# Patient Record
Sex: Female | Born: 1995 | Race: White | Hispanic: No | Marital: Single | Smoking: Never smoker
Health system: Southern US, Community
[De-identification: ages and names within clinical notes are randomized; demographics above are authoritative.]

## PROBLEM LIST (undated history)

## (undated) DIAGNOSIS — D649 Anemia, unspecified: Secondary | ICD-10-CM

## (undated) DIAGNOSIS — Z789 Other specified health status: Secondary | ICD-10-CM

## (undated) HISTORY — PX: NO PAST SURGERIES: SHX2092

---

## 2016-03-29 ENCOUNTER — Encounter (HOSPITAL_COMMUNITY): Payer: Self-pay | Admitting: Obstetrics and Gynecology

## 2016-03-29 ENCOUNTER — Other Ambulatory Visit (HOSPITAL_COMMUNITY): Payer: Self-pay | Admitting: Obstetrics and Gynecology

## 2016-03-29 DIAGNOSIS — Z3689 Encounter for other specified antenatal screening: Secondary | ICD-10-CM

## 2016-03-29 DIAGNOSIS — Z3A20 20 weeks gestation of pregnancy: Secondary | ICD-10-CM

## 2016-03-29 DIAGNOSIS — IMO0002 Reserved for concepts with insufficient information to code with codable children: Secondary | ICD-10-CM

## 2016-04-05 ENCOUNTER — Encounter (HOSPITAL_COMMUNITY): Payer: Self-pay

## 2016-04-08 ENCOUNTER — Encounter (HOSPITAL_COMMUNITY): Payer: Self-pay

## 2016-04-08 ENCOUNTER — Other Ambulatory Visit (HOSPITAL_COMMUNITY): Payer: Self-pay | Admitting: Obstetrics and Gynecology

## 2016-04-08 ENCOUNTER — Ambulatory Visit (HOSPITAL_COMMUNITY)
Admission: RE | Admit: 2016-04-08 | Discharge: 2016-04-08 | Disposition: A | Discharge status: Home / Self Care | Payer: Medicaid Other | Source: Ambulatory Visit | Attending: Obstetrics and Gynecology | Admitting: Obstetrics and Gynecology

## 2016-04-08 DIAGNOSIS — O358XX0 Maternal care for other (suspected) fetal abnormality and damage, not applicable or unspecified: Secondary | ICD-10-CM | POA: Insufficient documentation

## 2016-04-08 DIAGNOSIS — Z3689 Encounter for other specified antenatal screening: Secondary | ICD-10-CM

## 2016-04-08 DIAGNOSIS — Z3A20 20 weeks gestation of pregnancy: Secondary | ICD-10-CM

## 2016-04-08 DIAGNOSIS — Z36 Encounter for antenatal screening of mother: Secondary | ICD-10-CM | POA: Insufficient documentation

## 2016-04-08 DIAGNOSIS — IMO0002 Reserved for concepts with insufficient information to code with codable children: Secondary | ICD-10-CM

## 2016-04-08 DIAGNOSIS — O359XX0 Maternal care for (suspected) fetal abnormality and damage, unspecified, not applicable or unspecified: Secondary | ICD-10-CM

## 2016-04-08 HISTORY — DX: Other specified health status: Z78.9

## 2016-04-15 ENCOUNTER — Encounter (HOSPITAL_COMMUNITY): Payer: Self-pay

## 2017-02-08 ENCOUNTER — Encounter (HOSPITAL_COMMUNITY): Payer: Self-pay

## 2017-07-14 LAB — OB RESULTS CONSOLE HEPATITIS B SURFACE ANTIGEN: HEP B S AG: NEGATIVE

## 2017-07-14 LAB — OB RESULTS CONSOLE HIV ANTIBODY (ROUTINE TESTING): HIV: NONREACTIVE

## 2017-07-14 LAB — OB RESULTS CONSOLE GC/CHLAMYDIA
Chlamydia: NEGATIVE
Gonorrhea: NEGATIVE

## 2017-07-14 LAB — OB RESULTS CONSOLE RPR: RPR: NONREACTIVE

## 2017-07-14 LAB — OB RESULTS CONSOLE RUBELLA ANTIBODY, IGM: Rubella: IMMUNE

## 2017-08-01 IMAGING — US US MFM OB DETAIL+14 WK
1 series · 14 of 28 positions shown · non-contrast
Comparison: none

[Series 1: us mfm ob detail+14 wk · 79 acquisitions, 14 frames shown]
[im 3/79]
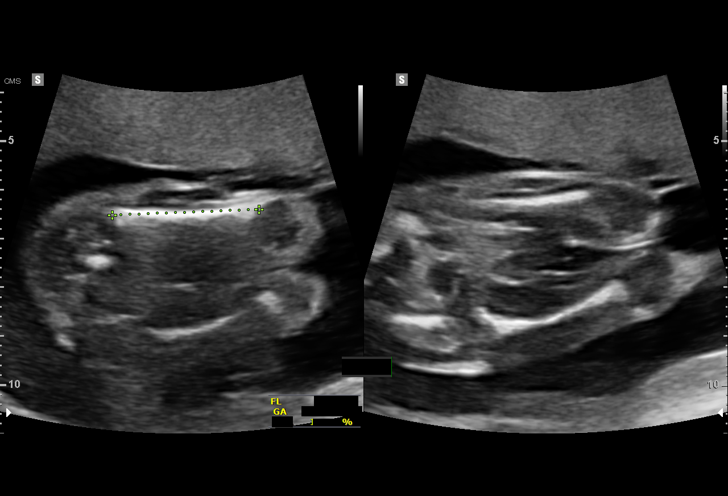
[im 9/79]
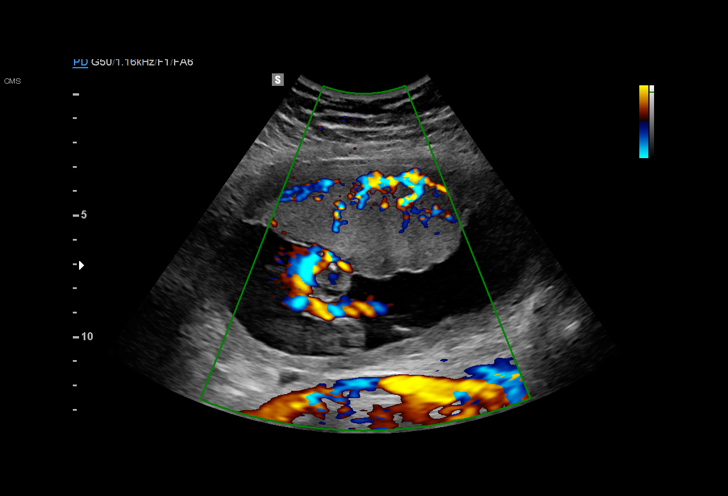
[im 15/79]
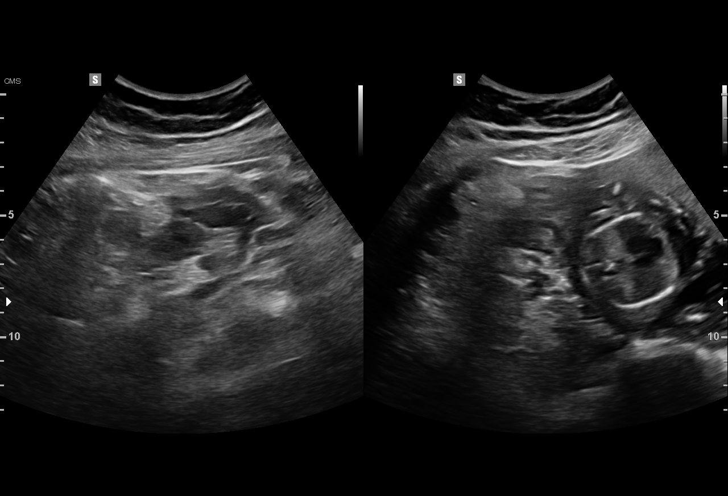
[im 21/79]
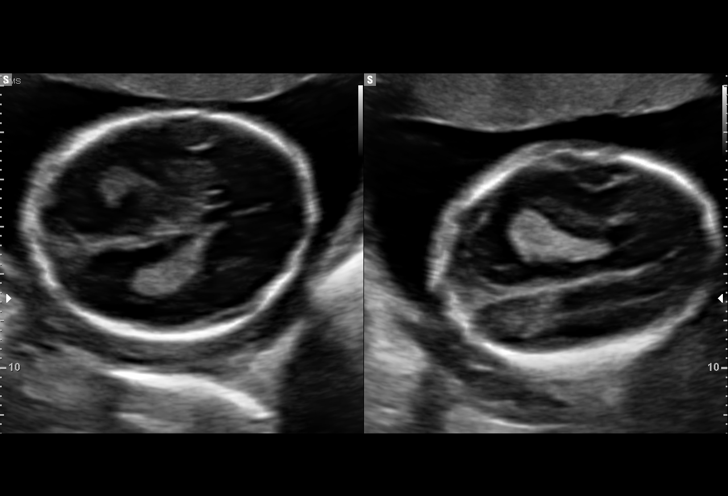
[im 27/79]
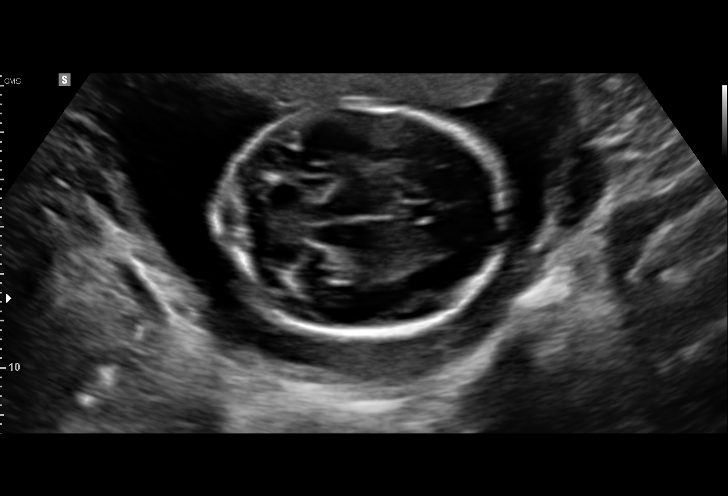
[im 32/79]
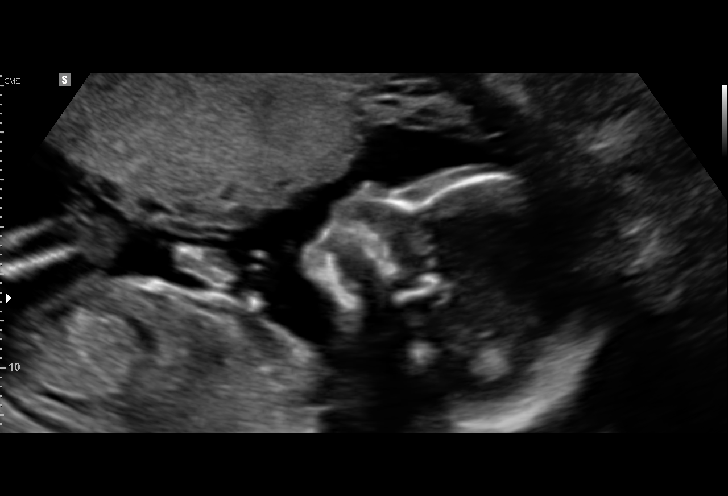
[im 38/79]
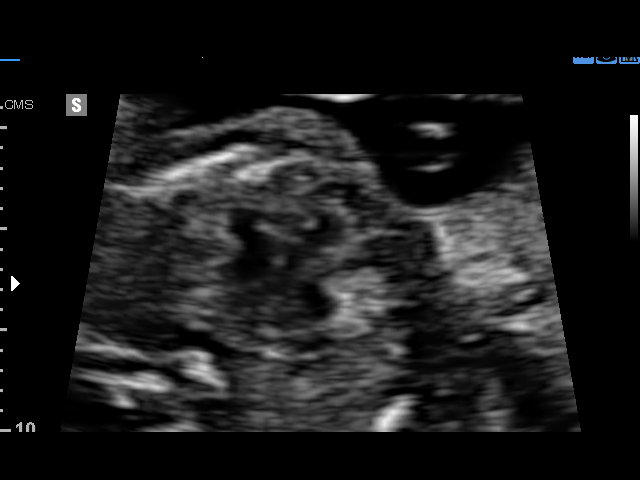
[im 44/79]
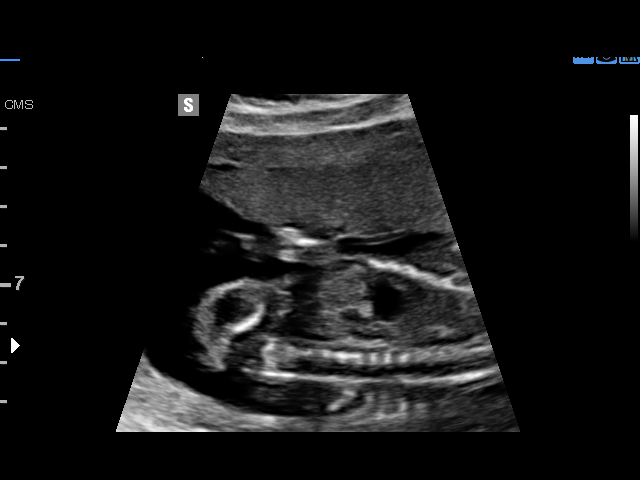
[im 50/79]
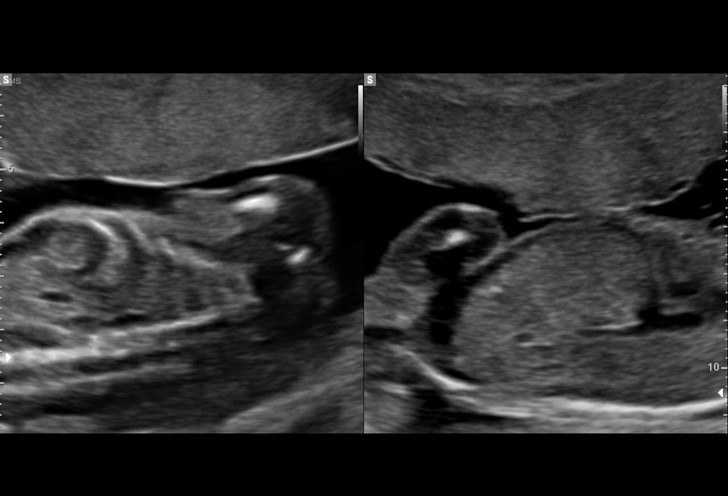
[im 55/79]
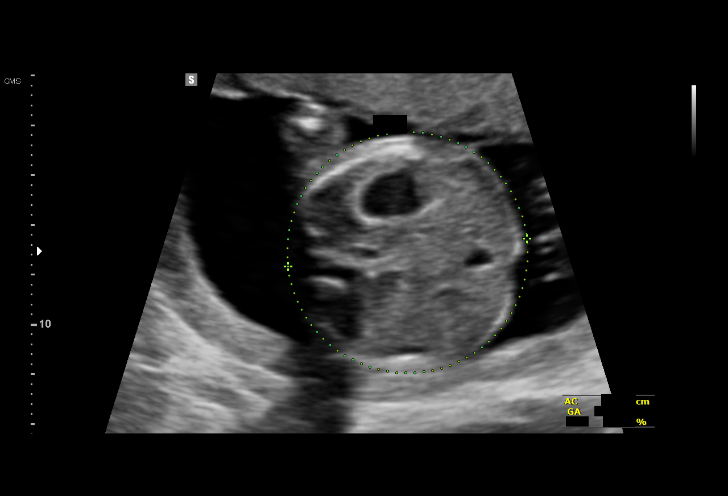
[im 61/79]
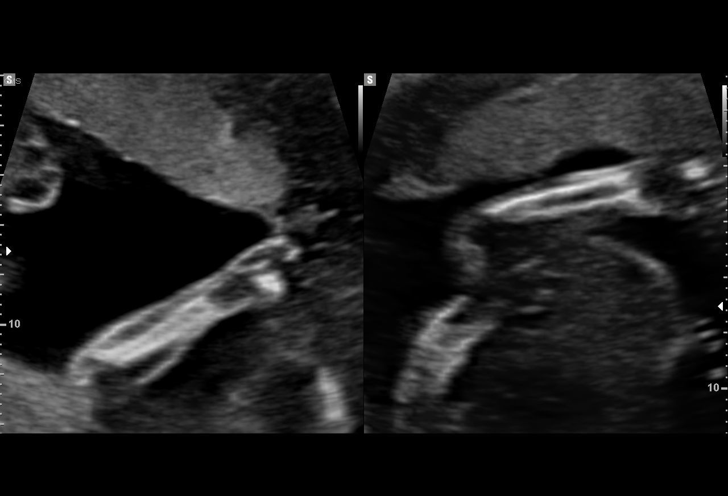
[im 67/79]
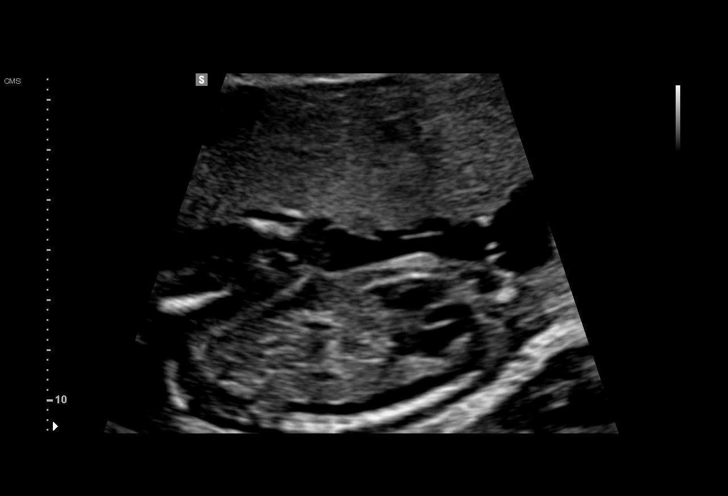
[im 73/79]
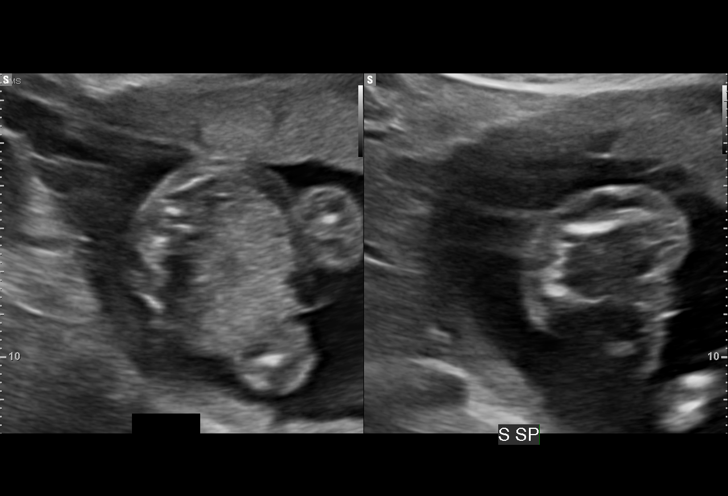
[im 79/79]
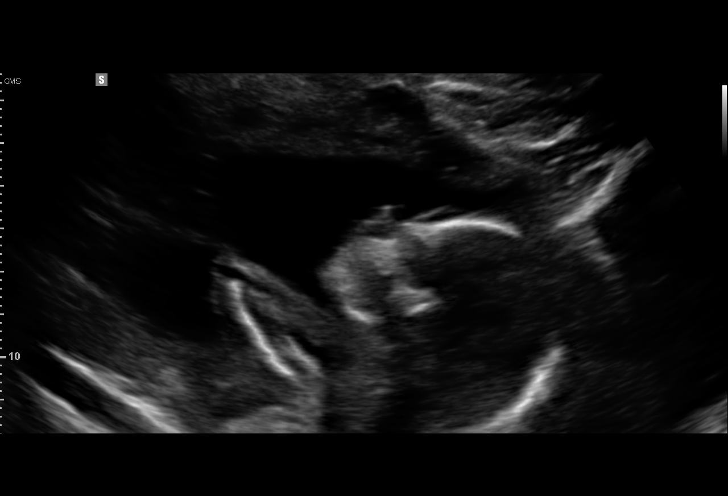

[14 of 28 positions shown; findings below may reference images not displayed]

SANNIA DO

1  DAGOBERTO LACROIX           [id number]      [id number]     [id number]
Indications

20 weeks gestation of pregnancy
Detailed fetal anatomic survey                 Z36
Fetal abnormality - other known or
suspected (shortened femur)
OB History

Gravidity:    1         Term:   0        Prem:   0        SAB:   0
TOP:          0       Ectopic:  0        Living: 0
Fetal Evaluation

Num Of Fetuses:     1
Fetal Heart         154
Rate(bpm):
Cardiac Activity:   Observed
Presentation:       Cephalic
Placenta:           Anterior, above cervical os
P. Cord Insertion:  Visualized, central

Amniotic Fluid
AFI FV:      Subjectively within normal limits

Largest Pocket(cm)
5.34
Biometry

BPD:      47.6  mm     G. Age:  20w 3d         67  %    CI:        70.39   %   70 - 86
FL/HC:      16.1   %   16.8 -
HC:      180.9  mm     G. Age:  20w 3d         65  %    HC/AC:      1.23       1.09 -
AC:       147   mm     G. Age:  20w 0d         44  %    FL/BPD:     61.1   %
FL:       29.1  mm     G. Age:  19w 0d         12  %    FL/AC:      19.8   %   20 - 24
HUM:      25.8  mm     G. Age:  18w 0d        < 5  %
CER:      20.8  mm     G. Age:  19w 6d         46  %
CM:        4.6  mm

Est. FW:     304  gm    0 lb 11 oz      43  %
Gestational Age

LMP:           20w 0d       Date:   11/20/15                 EDD:   08/26/16
U/S Today:     20w 0d                                        EDD:   08/26/16
Best:          20w 0d    Det. By:   LMP  (11/20/15)          EDD:   08/26/16
Anatomy

Cranium:               Appears normal         Aortic Arch:            Appears normal
Cavum:                 Appears normal         Ductal Arch:            Appears normal
Ventricles:            Appears normal         Diaphragm:              Appears normal
Choroid Plexus:        Appears normal         Stomach:                Appears normal, left
sided
Cerebellum:            Appears normal         Abdomen:                Appears normal
Posterior Fossa:       Appears normal         Abdominal Wall:         Appears nml (cord
insert, abd wall)
Nuchal Fold:           Appears normal         Cord Vessels:           Appears normal (3
vessel cord)
Face:                  Appears normal         Kidneys:                Appear normal
(orbits and profile)
Lips:                  Appears normal         Bladder:                Appears normal
Thoracic:              Appears normal         Spine:                  Appears normal
Heart:                 Appears normal         Upper Extremities:      Appears normal
(4CH, axis, and
situs)
RVOT:                  Appears normal         Lower Extremities:      Appears normal
LVOT:                  Appears normal

Other:  Fetus appears to be a male. Heels and 5th digit visualized. Nasal
bone visualized.
Cervix Uterus Adnexa

Cervix
Length:           3.49  cm.
Normal appearance by transabdominal scan.

Uterus
No abnormality visualized.

Left Ovary
Within normal limits.

Right Ovary
Within normal limits.

Cul De Sac:   No free fluid seen.

Adnexa:       No abnormality visualized.
Impression

SIUP at 20+0 weeks
Normal detailed fetal anatomy
Markers of aneuploidy: none
Normal amniotic fluid volume
Measurements consistent with LMP dating; long bone
measured shorter than average but no stigmata of a skeletal
dysplasia
Recommendations

Follow-up as clinically indicated

## 2017-08-19 NOTE — L&D Delivery Note (Signed)
Delivery Note At  a viable female was delivered via  (Presentation:OA ;  ).  APGAR: 8,9 ; weight pending  .   Placenta status:complete , .3v  Cord:  with the following complications:none .  Cord pH: N/A  Anesthesia:  Epidural Episiotomy:  None Lacerations:  None Suture Repair: N/A Est. Blood Loss (mL):  100cc  Mom to postpartum.  Baby to Couplet care / Skin to Skin.  Miranda Frank 02/03/2018, 4:55 AM

## 2018-02-03 ENCOUNTER — Other Ambulatory Visit: Payer: Self-pay

## 2018-02-03 ENCOUNTER — Inpatient Hospital Stay (HOSPITAL_COMMUNITY): Payer: Medicaid Other | Admitting: Anesthesiology

## 2018-02-03 ENCOUNTER — Encounter (HOSPITAL_COMMUNITY): Payer: Self-pay

## 2018-02-03 ENCOUNTER — Inpatient Hospital Stay (HOSPITAL_COMMUNITY)
Admission: AD | Admit: 2018-02-03 | Discharge: 2018-02-05 | DRG: 807 | Disposition: A | Discharge status: Home / Self Care | Payer: Medicaid Other | Attending: Obstetrics and Gynecology | Admitting: Obstetrics and Gynecology

## 2018-02-03 DIAGNOSIS — Z3A36 36 weeks gestation of pregnancy: Secondary | ICD-10-CM

## 2018-02-03 DIAGNOSIS — O99214 Obesity complicating childbirth: Secondary | ICD-10-CM | POA: Diagnosis present

## 2018-02-03 DIAGNOSIS — O358XX0 Maternal care for other (suspected) fetal abnormality and damage, not applicable or unspecified: Secondary | ICD-10-CM | POA: Diagnosis present

## 2018-02-03 DIAGNOSIS — O4200 Premature rupture of membranes, onset of labor within 24 hours of rupture, unspecified weeks of gestation: Secondary | ICD-10-CM | POA: Diagnosis present

## 2018-02-03 DIAGNOSIS — O42913 Preterm premature rupture of membranes, unspecified as to length of time between rupture and onset of labor, third trimester: Principal | ICD-10-CM | POA: Diagnosis present

## 2018-02-03 DIAGNOSIS — E669 Obesity, unspecified: Secondary | ICD-10-CM | POA: Diagnosis present

## 2018-02-03 DIAGNOSIS — O42013 Preterm premature rupture of membranes, onset of labor within 24 hours of rupture, third trimester: Secondary | ICD-10-CM

## 2018-02-03 HISTORY — DX: Anemia, unspecified: D64.9

## 2018-02-03 LAB — RPR: RPR Ser Ql: NONREACTIVE

## 2018-02-03 LAB — CBC
HEMATOCRIT: 34.2 % — AB (ref 36.0–46.0)
HEMOGLOBIN: 10.8 g/dL — AB (ref 12.0–15.0)
MCH: 26.3 pg (ref 26.0–34.0)
MCHC: 31.6 g/dL (ref 30.0–36.0)
MCV: 83.4 fL (ref 78.0–100.0)
Platelets: 189 10*3/uL (ref 150–400)
RBC: 4.1 MIL/uL (ref 3.87–5.11)
RDW: 19.6 % — AB (ref 11.5–15.5)
WBC: 6.8 10*3/uL (ref 4.0–10.5)

## 2018-02-03 LAB — POCT FERN TEST: POCT FERN TEST: POSITIVE

## 2018-02-03 LAB — ABO/RH: ABO/RH(D): A POS

## 2018-02-03 LAB — TYPE AND SCREEN
ABO/RH(D): A POS
Antibody Screen: NEGATIVE

## 2018-02-03 LAB — GROUP B STREP BY PCR: Group B strep by PCR: NEGATIVE

## 2018-02-03 MED ORDER — ONDANSETRON HCL 4 MG/2ML IJ SOLN: 4.0000 mg | INTRAMUSCULAR | Status: DC | PRN

## 2018-02-03 MED ORDER — LACTATED RINGERS IV SOLN
500.0000 mL | Freq: Once | INTRAVENOUS | Status: AC
  Administered 2018-02-03: 500 mL via INTRAVENOUS

## 2018-02-03 MED ORDER — LIDOCAINE HCL (PF) 1 % IJ SOLN
INTRAMUSCULAR | Status: DC | PRN
  Administered 2018-02-03: 5 mL via EPIDURAL
  Administered 2018-02-03: 3 mL via EPIDURAL
  Administered 2018-02-03: 2 mL via EPIDURAL

## 2018-02-03 MED ORDER — DIBUCAINE 1 % RE OINT: 1.0000 "application " | TOPICAL_OINTMENT | RECTAL | Status: DC | PRN

## 2018-02-03 MED ORDER — WITCH HAZEL-GLYCERIN EX PADS: 1.0000 "application " | MEDICATED_PAD | CUTANEOUS | Status: DC | PRN

## 2018-02-03 MED ORDER — LACTATED RINGERS IV SOLN: 500.0000 mL | INTRAVENOUS | Status: DC | PRN

## 2018-02-03 MED ORDER — SIMETHICONE 80 MG PO CHEW: 80.0000 mg | CHEWABLE_TABLET | ORAL | Status: DC | PRN

## 2018-02-03 MED ORDER — PRENATAL MULTIVITAMIN CH
1.0000 | ORAL_TABLET | Freq: Every day | ORAL | Status: DC
  Administered 2018-02-03 – 2018-02-05 (×3): 1 via ORAL
  Filled 2018-02-03 (×3): qty 1

## 2018-02-03 MED ORDER — ONDANSETRON HCL 4 MG PO TABS: 4.0000 mg | ORAL_TABLET | ORAL | Status: DC | PRN

## 2018-02-03 MED ORDER — DIPHENHYDRAMINE HCL 25 MG PO CAPS: 25.0000 mg | ORAL_CAPSULE | Freq: Four times a day (QID) | ORAL | Status: DC | PRN

## 2018-02-03 MED ORDER — BENZOCAINE-MENTHOL 20-0.5 % EX AERO
1.0000 "application " | INHALATION_SPRAY | CUTANEOUS | Status: DC | PRN
  Administered 2018-02-03: 1 via TOPICAL
  Filled 2018-02-03: qty 56

## 2018-02-03 MED ORDER — ACETAMINOPHEN 325 MG PO TABS
650.0000 mg | ORAL_TABLET | ORAL | Status: DC | PRN
Start: 2018-02-03 — End: 2018-02-05
  Administered 2018-02-03: 650 mg via ORAL
  Filled 2018-02-03: qty 2

## 2018-02-03 MED ORDER — OXYTOCIN BOLUS FROM INFUSION
500.0000 mL | Freq: Once | INTRAVENOUS | Status: AC
  Administered 2018-02-03: 500 mL via INTRAVENOUS

## 2018-02-03 MED ORDER — FENTANYL CITRATE (PF) 100 MCG/2ML IJ SOLN: 50.0000 ug | INTRAMUSCULAR | Status: DC | PRN

## 2018-02-03 MED ORDER — ZOLPIDEM TARTRATE 5 MG PO TABS: 5.0000 mg | ORAL_TABLET | Freq: Every evening | ORAL | Status: DC | PRN

## 2018-02-03 MED ORDER — LACTATED RINGERS IV SOLN
INTRAVENOUS | Status: DC
  Administered 2018-02-03: 04:00:00 via INTRAVENOUS

## 2018-02-03 MED ORDER — TETANUS-DIPHTH-ACELL PERTUSSIS 5-2.5-18.5 LF-MCG/0.5 IM SUSP: 0.5000 mL | Freq: Once | INTRAMUSCULAR | Status: DC

## 2018-02-03 MED ORDER — LIDOCAINE HCL (PF) 1 % IJ SOLN
30.0000 mL | INTRAMUSCULAR | Status: DC | PRN
  Filled 2018-02-03: qty 30

## 2018-02-03 MED ORDER — ONDANSETRON HCL 4 MG/2ML IJ SOLN: 4.0000 mg | Freq: Four times a day (QID) | INTRAMUSCULAR | Status: DC | PRN

## 2018-02-03 MED ORDER — PHENYLEPHRINE 40 MCG/ML (10ML) SYRINGE FOR IV PUSH (FOR BLOOD PRESSURE SUPPORT)
80.0000 ug | PREFILLED_SYRINGE | INTRAVENOUS | Status: DC | PRN
  Filled 2018-02-03: qty 5

## 2018-02-03 MED ORDER — OXYTOCIN 40 UNITS IN LACTATED RINGERS INFUSION - SIMPLE MED
2.5000 [IU]/h | INTRAVENOUS | Status: DC
  Administered 2018-02-03: 2.5 [IU]/h via INTRAVENOUS
  Filled 2018-02-03: qty 1000

## 2018-02-03 MED ORDER — ACETAMINOPHEN 325 MG PO TABS: 650.0000 mg | ORAL_TABLET | ORAL | Status: DC | PRN

## 2018-02-03 MED ORDER — DIPHENHYDRAMINE HCL 50 MG/ML IJ SOLN: 12.5000 mg | INTRAMUSCULAR | Status: DC | PRN

## 2018-02-03 MED ORDER — LACTATED RINGERS IV SOLN: 500.0000 mL | Freq: Once | INTRAVENOUS | Status: AC

## 2018-02-03 MED ORDER — FENTANYL 2.5 MCG/ML BUPIVACAINE 1/10 % EPIDURAL INFUSION (WH - ANES)
14.0000 mL/h | INTRAMUSCULAR | Status: DC | PRN
  Administered 2018-02-03: 12 mL/h via EPIDURAL
  Filled 2018-02-03: qty 100

## 2018-02-03 MED ORDER — SENNOSIDES-DOCUSATE SODIUM 8.6-50 MG PO TABS
2.0000 | ORAL_TABLET | ORAL | Status: DC
  Administered 2018-02-03 – 2018-02-05 (×2): 2 via ORAL
  Filled 2018-02-03 (×2): qty 2

## 2018-02-03 MED ORDER — SOD CITRATE-CITRIC ACID 500-334 MG/5ML PO SOLN: 30.0000 mL | ORAL | Status: DC | PRN

## 2018-02-03 MED ORDER — OXYCODONE-ACETAMINOPHEN 5-325 MG PO TABS: 2.0000 | ORAL_TABLET | ORAL | Status: DC | PRN

## 2018-02-03 MED ORDER — IBUPROFEN 600 MG PO TABS
600.0000 mg | ORAL_TABLET | Freq: Four times a day (QID) | ORAL | Status: DC
  Administered 2018-02-03 – 2018-02-05 (×10): 600 mg via ORAL
  Filled 2018-02-03 (×10): qty 1

## 2018-02-03 MED ORDER — EPHEDRINE 5 MG/ML INJ
10.0000 mg | INTRAVENOUS | Status: DC | PRN
Start: 2018-02-03 — End: 2018-02-03
  Filled 2018-02-03: qty 2

## 2018-02-03 MED ORDER — COCONUT OIL OIL: 1.0000 "application " | TOPICAL_OIL | Status: DC | PRN

## 2018-02-03 MED ORDER — OXYCODONE-ACETAMINOPHEN 5-325 MG PO TABS: 1.0000 | ORAL_TABLET | ORAL | Status: DC | PRN

## 2018-02-03 MED ORDER — PHENYLEPHRINE 40 MCG/ML (10ML) SYRINGE FOR IV PUSH (FOR BLOOD PRESSURE SUPPORT)
80.0000 ug | PREFILLED_SYRINGE | INTRAVENOUS | Status: DC | PRN
  Filled 2018-02-03: qty 10
  Filled 2018-02-03: qty 5

## 2018-02-03 MED ORDER — EPHEDRINE 5 MG/ML INJ
10.0000 mg | INTRAVENOUS | Status: DC | PRN
  Filled 2018-02-03: qty 2

## 2018-02-03 NOTE — Progress Notes (Signed)
Patient called out for increased bleeding. Patient voided with assistance to the restroom and had small bleeding upon assessment. No reports of dizziness. Vital signs WNL. Will continue to monitor.

## 2018-02-03 NOTE — Anesthesia Procedure Notes (Signed)
Epidural Patient location during procedure: OB Start time: 02/03/2018 4:20 AM End time: 02/03/2018 4:26 AM  Staffing Anesthesiologist: Cecile Hearingurk, Marieclaire Bettenhausen Edward, MD Performed: anesthesiologist   Preanesthetic Checklist Completed: patient identified, pre-op evaluation, timeout performed, IV checked, risks and benefits discussed and monitors and equipment checked  Epidural Patient position: sitting Prep: DuraPrep Patient monitoring: blood pressure and continuous pulse ox Approach: midline Location: L3-L4 Injection technique: LOR air  Needle:  Needle type: Tuohy  Needle gauge: 17 G Needle length: 9 cm Needle insertion depth: 5 cm Catheter size: 19 Gauge Catheter at skin depth: 10 cm Test dose: negative and Other (1% Lidocaine)  Additional Notes Patient identified.  Risk benefits discussed including failed block, incomplete pain control, headache, nerve damage, paralysis, blood pressure changes, nausea, vomiting, reactions to medication both toxic or allergic, and postpartum back pain.  Patient expressed understanding and wished to proceed.  All questions were answered.  Sterile technique used throughout procedure and epidural site dressed with sterile barrier dressing. No paresthesia or other complications noted. The patient did not experience any signs of intravascular injection such as tinnitus or metallic taste in mouth nor signs of intrathecal spread such as rapid motor block. Please see nursing notes for vital signs. Reason for block:procedure for pain

## 2018-02-03 NOTE — MAU Note (Signed)
[facility]. Reports SROM at 0115. Contractions started after that. Now 2-3 mins apart. Denies bleeding.  +FM

## 2018-02-03 NOTE — Anesthesia Postprocedure Evaluation (Signed)
Anesthesia Post Note  Patient: Miranda Frank  Procedure(s) Performed: AN AD HOC LABOR EPIDURAL     Patient location during evaluation: Mother Baby Anesthesia Type: Epidural Level of consciousness: awake and alert and oriented Pain management: satisfactory to patient Vital Signs Assessment: post-procedure vital signs reviewed and stable Respiratory status: respiratory function stable and spontaneous breathing Cardiovascular status: blood pressure returned to baseline Postop Assessment: no headache, no backache, spinal receding, patient able to bend at knees and adequate PO intake Anesthetic complications: no    Last Vitals:  Vitals:   02/03/18 0545 02/03/18 0635  BP: 116/64 111/69  Pulse: 72 60  Resp: 17 18  Temp:  36.7 C  SpO2:      Last Pain:  Vitals:   02/03/18 0635  TempSrc: Oral  PainSc: 0-No pain   Pain Goal:                 [place] Jakubiak

## 2018-02-03 NOTE — H&P (Signed)
Miranda PaxHeather Frank is a 22 y.o. female, G2P1001 at 36.6 weeks, presenting for PROM at 0115 clear fluid.  FM+  Prenatal hx remarkable for US for growth: EFW >94% 3372 grams (7#7oz) AC >98%. Bladder over distended measuring 9.9x4.4x6.7 and bilateral hydroureter right > left.    History of present pregnancy: Patient entered care at 7.5 weeks.   EDC of 7/10 was established by LMP/US.   Anatomy scan:  7.5 weeks, with normal findings and an  placenta.   Additional US evaluations: Growth Significant prenatal events: overdistended bladder with bilateral hydroureter   Last evaluation:  6/12  OB History    Gravida  2   Para  1   Term  1   Preterm      AB      Living  1     SAB      TAB      Ectopic      Multiple      Live Births  1          Past Medical History:  Diagnosis Date  . Anemia   . Medical history non-contributory    Past Surgical History:  Procedure Laterality Date  . NO PAST SURGERIES     Family History: family history is not on file. Social History:  reports that she has never smoked. She has never used smokeless tobacco. She reports that she does not drink alcohol or use drugs.   Prenatal Transfer Tool  Maternal Diabetes: No Genetic Screening: Normal Maternal Ultrasounds/Referrals: Abnormal:  Findings:   Other: bladder and uter abnormality Fetal Ultrasounds or other Referrals:  Referred to Materal Fetal Medicine  Maternal Substance Abuse:  No Significant Maternal Medications:  None Significant Maternal Lab Results: None  TDAP UTD Flu no  ROS: Review of Systems  Constitutional: Negative.   HENT: Negative.   Eyes: Negative.   Respiratory: Negative.   Cardiovascular: Negative.   Gastrointestinal: Positive for abdominal pain.  Genitourinary: Negative.   Musculoskeletal: Negative.   Skin: Negative.   Neurological: Negative.   Endo/Heme/Allergies: Negative.   Psychiatric/Behavioral: Negative.     No Known Allergies   Dilation: 4 Effacement  (%): 80 Station: -2 Exam by:: TLytle RN  Blood pressure 108/67, pulse 66, temperature 98 F (36.7 C), temperature source Oral, resp. rate 18, height 5\' 2"  (1.575 m), weight 90.3 kg (199 lb), SpO2 98 %, unknown if currently breastfeeding.  Chest clear Heart RRR without murmur Abd gravid, NT, FH appropriate Pelvic: per RN 4cm Ext: Neg  FHR: Category 1  FHT 120 accels, no decels UCs:  2 in 10 minutes  Prenatal labs: ABO, Rh:  A pos Antibody:  Neg Rubella:   Imm RPR:   NR HBsAg:   Neg HIV:   NR GBS:  Pending  Pap:  2018 neg GC:  Neg Chlamydia:  Neg Genetic screenings:  Neg Glucola:  105 Other:   Hgb 13.8 at NOB, 10.8 at 28 weeks       Assessment/Plan: IUP at 36.6 IUP with PROM at 0115 Cat 1 strip Fetal enlarged bladder and distended bladder  Plan: Admit to Birthing Suite  Routine [facility] orders Pain med/epidural prn GBS sent  Henderson NewcomerNancy Jean ProtheroCNM, MSN 02/03/2018, 3:04 AM

## 2018-02-04 LAB — CBC
HCT: 31.9 % — ABNORMAL LOW (ref 36.0–46.0)
HEMOGLOBIN: 10 g/dL — AB (ref 12.0–15.0)
MCH: 26.5 pg (ref 26.0–34.0)
MCHC: 31.3 g/dL (ref 30.0–36.0)
MCV: 84.4 fL (ref 78.0–100.0)
Platelets: 166 10*3/uL (ref 150–400)
RBC: 3.78 MIL/uL — AB (ref 3.87–5.11)
RDW: 19.5 % — ABNORMAL HIGH (ref 11.5–15.5)
WBC: 9 10*3/uL (ref 4.0–10.5)

## 2018-02-04 LAB — BIRTH TISSUE RECOVERY COLLECTION (PLACENTA DONATION)

## 2018-02-04 NOTE — Progress Notes (Signed)
Subjective: Postpartum Day # 1 : S/P NSVD due to PPROM. Pt found in shower with faimly in bedside and baby being held by dad. Patient up ad lib, denies syncope or dizziness. Reports consuming regular diet without issues and denies N/V. Patient reports 0 bowel movement + passing flatus.  Denies issues with urination and reports bleeding is "medium."  Patient is Bottle feeding and reports going well.  Desires (Undecided) for postpartum contraception.  Pain is being appropriately managed with use of motrin.   No laceration Feeding:  Bottle Contraceptive plan:  Undecided needs further education BB: Circ Out-pt  Objective: Vital signs in last 24 hours: Patient Vitals for the past 24 hrs:  BP Temp Temp src Pulse Resp SpO2  02/04/18 0603 105/69 - - 64 16 -  02/03/18 2246 118/75 98.6 F (37 C) Oral 78 18 98 %  02/03/18 1923 123/64 97.8 F (36.6 C) Oral 65 - 99 %  02/03/18 1445 - 98 F (36.7 C) Oral - - -     Physical Exam:  General: alert, cooperative and appears stated age Mood/Affect: Happy Lungs: clear to auscultation, no wheezes, rales or rhonchi, symmetric air entry.  Heart: normal rate, regular rhythm, normal S1, S2, no murmurs, rubs, clicks or gallops. Breast: breasts appear normal, no suspicious masses, no skin or nipple changes or axillary nodes. Abdomen:  + bowel sounds, soft, non-tender GU: perineum Intact, healing well. No signs of external hematomas.  Uterine Fundus: firm @ -2 Lochia: appropriate Skin: Warm, Dry. DVT Evaluation: No evidence of DVT seen on physical exam. Negative Homan's sign. No cords or calf tenderness. No significant calf/ankle edema. Mild non-pitting edema in lower extremities, equal bi-laterally.   CBC Latest Ref Rng & Units 02/04/2018 02/03/2018  WBC 4.0 - 10.5 K/uL 9.0 6.8  Hemoglobin 12.0 - 15.0 g/dL 10.0(L) 10.8(L)  Hematocrit 36.0 - 46.0 % 31.9(L) 34.2(L)  Platelets 150 - 400 K/uL 166 189    Results for orders placed or performed during the  hospital encounter of 02/03/18 (from the past 24 hour(s))  CBC     Status: Abnormal   Collection Time: 02/04/18  6:09 AM  Result Value Ref Range   WBC 9.0 4.0 - 10.5 K/uL   RBC 3.78 (L) 3.87 - 5.11 MIL/uL   Hemoglobin 10.0 (L) 12.0 - 15.0 g/dL   HCT 16.131.9 (L) 09.636.0 - 04.546.0 %   MCV 84.4 78.0 - 100.0 fL   MCH 26.5 26.0 - 34.0 pg   MCHC 31.3 30.0 - 36.0 g/dL   RDW 40.919.5 (H) 81.111.5 - 91.415.5 %   Platelets 166 150 - 400 K/uL  Collect bld for placenta donatation     Status: None   Collection Time: 02/04/18  6:10 AM  Result Value Ref Range   Placenta donation bld collect COLLECTED BY LABORATORY      CBG (last 3)  No results for input(s): GLUCAP in the last 72 hours.   I/O last 3 completed shifts: In: -  Out: 100 [Blood:100]   Assessment Postpartum Day # 1 : S/P NSVD due to PPROM. Pt stable. -2 involution. Bottlefeeding. Hemodynamically stable.   Plan: Continue other mgmt as ordered VTE prophylactics: Early ambulated as tolerates.  Pain control: Motrin/Tylenol PRN Education needs to be given regarding options for contraception. Plan for discharge tomorrow Dr. Mora ApplPinn to be updated on patient status  Miranda Wiedman NP-C, CNM 02/04/2018, 2:17 PM

## 2018-02-04 NOTE — Anesthesia Preprocedure Evaluation (Signed)
Anesthesia Evaluation  Patient identified by MRN, date of birth, ID band Patient awake    Reviewed: Allergy & Precautions, NPO status , Patient's Chart, lab work & pertinent test results  Airway Mallampati: III  TM Distance: >3 FB Neck ROM: Full    Dental  (+) Teeth Intact, Dental Advisory Given   Pulmonary neg pulmonary ROS,    Pulmonary exam normal breath sounds clear to auscultation       Cardiovascular negative cardio ROS Normal cardiovascular exam Rhythm:Regular Rate:Normal     Neuro/Psych negative neurological ROS     GI/Hepatic negative GI ROS, Neg liver ROS,   Endo/Other  Obesity   Renal/GU negative Renal ROS     Musculoskeletal negative musculoskeletal ROS (+)   Abdominal   Peds  Hematology negative hematology ROS (+) Plt 189k   Anesthesia Other Findings Day of surgery medications reviewed with the patient.  Reproductive/Obstetrics (+) Pregnancy                             Anesthesia Physical Anesthesia Plan  ASA: II  Anesthesia Plan: Epidural   Post-op Pain Management:    Induction:   PONV Risk Score and Plan: 2 and Treatment may vary due to age or medical condition  Airway Management Planned:   Additional Equipment:   Intra-op Plan:   Post-operative Plan:   Informed Consent: I have reviewed the patients History and Physical, chart, labs and discussed the procedure including the risks, benefits and alternatives for the proposed anesthesia with the patient or authorized representative who has indicated his/her understanding and acceptance.   Dental advisory given  Plan Discussed with:   Anesthesia Plan Comments: (Patient identified. Risks/Benefits/Options discussed with patient including but not limited to bleeding, infection, nerve damage, paralysis, failed block, incomplete pain control, headache, blood pressure changes, nausea, vomiting, reactions to  medication both or allergic, itching and postpartum back pain. Confirmed with bedside nurse the patient's most recent platelet count. Confirmed with patient that they are not currently taking any anticoagulation, have any bleeding history or any family history of bleeding disorders. Patient expressed understanding and wished to proceed. All questions were answered. )        Anesthesia Quick Evaluation

## 2018-02-05 MED ORDER — IBUPROFEN 600 MG PO TABS: 600.0000 mg | ORAL_TABLET | Freq: Four times a day (QID) | ORAL | 0 refills | Status: AC | PRN

## 2018-02-05 NOTE — Discharge Instructions (Signed)
Contraception Choices °Contraception, also called birth control, means things to use or ways to try not to get pregnant. °Hormonal birth control °This kind of birth control uses hormones. Here are some types of hormonal birth control: °· A tube that is put under skin of the arm (implant). The tube can stay in for as long as 3 years. °· Shots to get every 3 months (injections). °· Pills to take every day (birth control pills). °· A patch to change 1 time each week for 3 weeks (birth control patch). After that, the patch is taken off for 1 week. °· A ring to put in the vagina. The ring is left in for 3 weeks. Then it is taken out of the vagina for 1 week. Then a new ring is put in. °· Pills to take after unprotected sex (emergency birth control pills). ° °Barrier birth control °Here are some types of barrier birth control: °· A thin covering that is put on the penis before sex (female condom). The covering is thrown away after sex. °· A soft, loose covering that is put in the vagina before sex (female condom). The covering is thrown away after sex. °· A rubber bowl that sits over the cervix (diaphragm). The bowl must be made for you. The bowl is put into the vagina before sex. The bowl is left in for 6-8 hours after sex. It is taken out within 24 hours. °· A small, soft cup that fits over the cervix (cervical cap). The cup must be made for you. The cup can be left in for 6-8 hours after sex. It is taken out within 48 hours. °· A sponge that is put into the vagina before sex. It must be left in for at least 6 hours after sex. It must be taken out within 30 hours. Then it is thrown away. °· A chemical that kills or stops sperm from getting into the uterus (spermicide). It may be a pill, cream, jelly, or foam to put in the vagina. The chemical should be used at least 10-15 minutes before sex. ° °IUD (intrauterine) birth control °An IUD is a small, T-shaped piece of plastic. It is put inside the uterus. There are two  kinds: °· Hormone IUD. This kind can stay in for 3-5 years. °· Copper IUD. This kind can stay in for 10 years. ° °Permanent birth control °Here are some types of permanent birth control: °· Surgery to block the fallopian tubes. °· Having an insert put into each fallopian tube. °· Surgery to tie off the tubes that carry sperm (vasectomy). ° °Natural planning birth control °Here are some types of natural planning birth control: °· Not having sex on the days the woman could get pregnant. °· Using a calendar: °? To keep track of the length of each period. °? To find out what days pregnancy can happen. °? To plan to not have sex on days when pregnancy can happen. °· Watching for symptoms of ovulation and not having sex during ovulation. One way the woman can check for ovulation is to check her temperature. °· Waiting to have sex until after ovulation. ° °Summary °· Contraception, also called birth control, means things to use or ways to try not to get pregnant. °· Hormonal methods of birth control include implants, injections, pills, patches, vaginal rings, and emergency birth control pills. °· Barrier methods of birth control can include female condoms, female condoms, diaphragms, cervical caps, sponges, and spermicides. °· There are two types of   IUD (intrauterine device) birth control. An IUD can be put in a woman's uterus to prevent pregnancy for 3-5 years. °· Permanent sterilization can be done through a procedure for males, females, or both. °· Natural planning methods involve not having sex on the days when the woman could get pregnant. °This information is not intended to replace advice given to you by your health care provider. Make sure you discuss any questions you have with your health care provider. °Document Released: 06/02/2009 Document Revised: 08/15/2016 Document Reviewed: 08/15/2016 °Elsevier Interactive Patient Education © 2017 [employer] ° ° °Postpartum Care After Vaginal Delivery ° °The period of  time right after you deliver your newborn is called the postpartum period. °What kind of medical care will I receive? °· You may continue to receive fluids and medicines through an IV tube inserted into one of your veins. °· If an incision was made near your vagina (episiotomy) or if you had some vaginal tearing during delivery, cold compresses may be placed on your episiotomy or your tear. This helps to reduce pain and swelling. °· You may be given a squirt bottle to use when you go to the bathroom. You may use this until you are comfortable wiping as usual. To use the squirt bottle, follow these steps: °? Before you urinate, fill the squirt bottle with warm water. Do not use hot water. °? After you urinate, while you are sitting on the toilet, use the squirt bottle to rinse the area around your urethra and vaginal opening. This rinses away any urine and blood. °? You may do this instead of wiping. As you start healing, you may use the squirt bottle before wiping yourself. Make sure to wipe gently. °? Fill the squirt bottle with clean water every time you use the bathroom. °· You will be given sanitary pads to wear. °How can I expect to feel? °· You may not feel the need to urinate for several hours after delivery. °· You will have some soreness and pain in your abdomen and vagina. °· If you are breastfeeding, you may have uterine contractions every time you breastfeed for up to several weeks postpartum. Uterine contractions help your uterus return to its normal size. °· It is normal to have vaginal bleeding (lochia) after delivery. The amount and appearance of lochia is often similar to a menstrual period in the first week after delivery. It will gradually decrease over the next few weeks to a dry, yellow-brown discharge. For most women, lochia stops completely by 6-8 weeks after delivery. Vaginal bleeding can vary from woman to woman. °· Within the first few days after delivery, you may have breast engorgement.  This is when your breasts feel heavy, full, and uncomfortable. Your breasts may also throb and feel hard, tightly stretched, warm, and tender. After this occurs, you may have milk leaking from your breasts. Your health care provider can help you relieve discomfort due to breast engorgement. Breast engorgement should go away within a few days. °· You may feel more sad or worried than normal due to hormonal changes after delivery. These feelings should not last more than a few days. If these feelings do not go away after several days, speak with your health care provider. °How should I care for myself? °· Tell your health care provider if you have pain or discomfort. °· Drink enough water to keep your urine clear or pale yellow. °· Wash your hands thoroughly with soap and water for at least 20 seconds after   changing your sanitary pads, after using the toilet, and before holding or feeding your baby. °· If you are not breastfeeding, avoid touching your breasts a lot. Doing this can make your breasts produce more milk. °· If you become weak or lightheaded, or you feel like you might faint, ask for help before: °? Getting out of bed. °? Showering. °· Change your sanitary pads frequently. Watch for any changes in your flow, such as a sudden increase in volume, a change in color, the passing of large blood clots. If you pass a blood clot from your vagina, save it to show to your health care provider. Do not flush blood clots down the toilet without having your health care provider look at them. °· Make sure that all your vaccinations are up to date. This can help protect you and your baby from getting certain diseases. You may need to have immunizations done before you leave the hospital. °· If desired, talk with your health care provider about methods of family planning or birth control (contraception). °How can I start bonding with my baby? °Spending as much time as possible with your baby is very important. During this  time, you and your baby can get to know each other and develop a bond. Having your baby stay with you in your room (rooming in) can give you time to get to know your baby. Rooming in can also help you become comfortable caring for your baby. Breastfeeding can also help you bond with your baby. °How can I plan for returning home with my baby? °· Make sure that you have a car seat installed in your vehicle. °? Your car seat should be checked by a certified car seat installer to make sure that it is installed safely. °? Make sure that your baby fits into the car seat safely. °· Ask your health care provider any questions you have about caring for yourself or your baby. Make sure that you are able to contact your health care provider with any questions after leaving the hospital. °This information is not intended to replace advice given to you by your health care provider. Make sure you discuss any questions you have with your health care provider. °Document Released: 06/02/2007 Document Revised: 01/08/2016 Document Reviewed: 07/10/2015 °Elsevier Interactive Patient Education © 2018 [employer] ° ° °Postpartum Depression and Baby Blues °The postpartum period begins right after the birth of a baby. During this time, there is often a great amount of joy and excitement. It is also a time of many changes in the life of the parents. Regardless of how many times a mother gives birth, each child brings new challenges and dynamics to the family. It is not unusual to have feelings of excitement along with confusing shifts in moods, emotions, and thoughts. All mothers are at risk of developing postpartum depression or the "baby blues." These mood changes can occur right after giving birth, or they may occur many months after giving birth. The baby blues or postpartum depression can be mild or severe. Additionally, postpartum depression can go away rather quickly, or it can be a long-term condition. °What are the causes? °Raised  hormone levels and the rapid drop in those levels are thought to be a main cause of postpartum depression and the baby blues. A number of hormones change during and after pregnancy. Estrogen and progesterone usually decrease right after the delivery of your baby. The levels of thyroid hormone and various cortisol steroids also rapidly drop. Other factors   that play a role in these mood changes include major life events and genetics. °What increases the risk? °If you have any of the following risks for the baby blues or postpartum depression, know what symptoms to watch out for during the postpartum period. Risk factors that may increase the likelihood of getting the baby blues or postpartum depression include: °· Having a personal or family history of depression. °· Having depression while being pregnant. °· Having premenstrual mood issues or mood issues related to oral contraceptives. °· Having a lot of life stress. °· Having marital conflict. °· Lacking a social support network. °· Having a baby with special needs. °· Having health problems, such as diabetes. ° °What are the signs or symptoms? °Symptoms of baby blues include: °· Brief changes in mood, such as going from extreme happiness to sadness. °· Decreased concentration. °· Difficulty sleeping. °· Crying spells, tearfulness. °· Irritability. °· Anxiety. ° °Symptoms of postpartum depression typically begin within the first month after giving birth. These symptoms include: °· Difficulty sleeping or excessive sleepiness. °· Marked weight loss. °· Agitation. °· Feelings of worthlessness. °· Lack of interest in activity or food. ° °Postpartum psychosis is a very serious condition and can be dangerous. Fortunately, it is rare. Displaying any of the following symptoms is cause for immediate medical attention. Symptoms of postpartum psychosis include: °· Hallucinations and delusions. °· Bizarre or disorganized behavior. °· Confusion or disorientation. ° °How is this  diagnosed? °A diagnosis is made by an evaluation of your symptoms. There are no medical or lab tests that lead to a diagnosis, but there are various questionnaires that a health care provider may use to identify those with the baby blues, postpartum depression, or psychosis. Often, a screening tool called the Edinburgh Postnatal Depression Scale is used to diagnose depression in the postpartum period. °How is this treated? °The baby blues usually goes away on its own in 1-2 weeks. Social support is often all that is needed. You will be encouraged to get adequate sleep and rest. Occasionally, you may be given medicines to help you sleep. °Postpartum depression requires treatment because it can last several months or longer if it is not treated. Treatment may include individual or group therapy, medicine, or both to address any social, physiological, and psychological factors that may play a role in the depression. Regular exercise, a healthy diet, rest, and social support may also be strongly recommended. °Postpartum psychosis is more serious and needs treatment right away. Hospitalization is often needed. °Follow these instructions at home: °· Get as much rest as you can. Nap when the baby sleeps. °· Exercise regularly. Some women find yoga and walking to be beneficial. °· Eat a balanced and nourishing diet. °· Do little things that you enjoy. Have a cup of tea, take a bubble bath, read your favorite magazine, or listen to your favorite music. °· Avoid alcohol. °· Ask for help with household chores, cooking, grocery shopping, or running errands as needed. Do not try to do everything. °· Talk to people close to you about how you are feeling. Get support from your partner, family members, friends, or other new moms. °· Try to stay positive in how you think. Think about the things you are grateful for. °· Do not spend a lot of time alone. °· Only take over-the-counter or prescription medicine as directed by your health  care provider. °· Keep all your postpartum appointments. °· Let your health care provider know if you have any concerns. °Contact   a health care provider if: °You are having a reaction to or problems with your medicine. °Get help right away if: °· You have suicidal feelings. °· You think you may harm the baby or someone else. °This information is not intended to replace advice given to you by your health care provider. Make sure you discuss any questions you have with your health care provider. °Document Released: 05/09/2004 Document Revised: 01/11/2016 Document Reviewed: 05/17/2013 °Elsevier Interactive Patient Education © 2017 [employer] ° ° °

## 2018-02-05 NOTE — Discharge Summary (Signed)
OB Discharge Summary     Patient Name: Miranda Frank DOB: [birth year] MRN: [id number]  Date of admission: 02/03/2018 Delivering MD: Kenney Houseman   Date of discharge: 02/05/2018  Admitting diagnosis: 36 WEEKS ROM Intrauterine pregnancy: [redacted]w[redacted]d     Secondary diagnosis:  Active Problems:   PROM with onset of labor within 24 hours of rupture   SVD (spontaneous vaginal delivery)      Discharge diagnosis: Term Pregnancy Delivered and VBAC                                                                                                Post partum procedures:n/a  Augmentation: n/a  Complications: None  Hospital course:  Onset of Labor With Vaginal Delivery     22 y.o. yo [id number] at [redacted]w[redacted]d was admitted in Latent Labor on 02/03/2018. Patient had an uncomplicated labor course as follows:  Membrane Rupture Time/Date: 1:15 AM ,02/03/2018   Intrapartum Procedures: Episiotomy: None [1]                                         Lacerations:  None [1]  Patient had a delivery of a Viable infant. 02/03/2018  Information for the patient's newborn:  Shaddai, Shapley [[id number]]  Delivery Method: Vaginal, Spontaneous(Filed from Delivery Summary)    Pateint had an uncomplicated postpartum course.  She is ambulating, tolerating a regular diet, passing flatus, and urinating well. Patient is discharged home in stable condition on 02/05/18.   Physical exam  Vitals:   02/04/18 0603 02/04/18 1430 02/04/18 2213 02/05/18 0544  BP: 105/69 113/62 114/72 (!) 104/58  Pulse: 64 76 79 (!) 59  Resp: 16 16 18 15   Temp:  98 F (36.7 C) 98.6 F (37 C) 98.1 F (36.7 C)  TempSrc:  Oral Oral Oral  SpO2:  99% 99%   Weight:      Height:       General: alert, cooperative and no distress Lochia: appropriate Uterine Fundus: firm Incision: N/A DVT Evaluation: No evidence of DVT seen on physical exam. Negative Homan's sign. No cords or calf tenderness. No significant calf/ankle edema. Labs: Lab  Results  Component Value Date   WBC 9.0 02/04/2018   HGB 10.0 (L) 02/04/2018   HCT 31.9 (L) 02/04/2018   MCV 84.4 02/04/2018   PLT 166 02/04/2018   No flowsheet data found.  Discharge instruction: per After Visit Summary and "Baby and Me Booklet".  After visit meds:  Allergies as of 02/05/2018   No Known Allergies     Medication List    TAKE these medications   ibuprofen 600 MG tablet Commonly known as:  ADVIL,MOTRIN Take 1 tablet (600 mg total) by mouth every 6 (six) hours as needed for moderate pain or cramping.   PRENATAL VITAMIN PO Take 1 tablet by mouth daily.       Diet: routine diet  Activity: Advance as tolerated. Pelvic rest for 6 weeks.   Outpatient follow up:6 weeks Follow up Appt:No future appointments.  Follow up Visit:No follow-ups on file.  Postpartum contraception: Undecided  Newborn Data: Live born female  Birth Weight: 7 lb 14.1 oz (3575 g) APGAR: 8, 9  Newborn Delivery   Birth date/time:  02/03/2018 04:39:00 Delivery type:  Vaginal, Spontaneous     Baby Feeding: Bottle Disposition:home with mother   02/05/2018 Janeece RiggersEllis K Greer, CNM
